# Patient Record
Sex: Female | Born: 1975 | Race: White | Hispanic: No | Marital: Married | Smoking: Current every day smoker
Health system: Southern US, Community
[De-identification: ages and names within clinical notes are randomized; demographics above are authoritative.]

---

## 2016-05-14 ENCOUNTER — Emergency Department (INDEPENDENT_AMBULATORY_CARE_PROVIDER_SITE_OTHER)
Admission: EM | Admit: 2016-05-14 | Discharge: 2016-05-14 | Disposition: A | Discharge status: Home / Self Care | Payer: 59 | Source: Home / Self Care | Attending: Family Medicine | Admitting: Family Medicine

## 2016-05-14 ENCOUNTER — Emergency Department (INDEPENDENT_AMBULATORY_CARE_PROVIDER_SITE_OTHER): Payer: 59

## 2016-05-14 ENCOUNTER — Encounter: Payer: Self-pay | Admitting: Emergency Medicine

## 2016-05-14 DIAGNOSIS — M79642 Pain in left hand: Secondary | ICD-10-CM

## 2016-05-14 DIAGNOSIS — M25532 Pain in left wrist: Secondary | ICD-10-CM

## 2016-05-14 DIAGNOSIS — S63631A Sprain of interphalangeal joint of left index finger, initial encounter: Secondary | ICD-10-CM

## 2016-05-14 DIAGNOSIS — S63633A Sprain of interphalangeal joint of left middle finger, initial encounter: Secondary | ICD-10-CM

## 2016-05-14 NOTE — ED Provider Notes (Signed)
[facility]    CSN: [id number] Arrival date & time: 05/14/16  1942     History   Chief Complaint Chief Complaint  Patient presents with  . Hand Pain    HPI Renee Maddox is a 41 y.o. female.   Patient fell on ice 3.5 months ago, injuring her left hand.  She complains of persistent soreness in her left second finger, and soreness with decreased range of motion in her left third finger.   The history is provided by the patient.  Hand Pain  This is a chronic problem. Episode onset: 3.5 months ago. The problem occurs constantly. The problem has not changed since onset.Exacerbated by: flexing fingers. Nothing relieves the symptoms. Treatments tried: splinting. The treatment provided no relief.    History reviewed. No pertinent past medical history.  There are no active problems to display for this patient.   History reviewed. No pertinent surgical history.  OB History    No data available       Home Medications    Prior to Admission medications   Medication Sig Start Date End Date Taking? Authorizing Provider  atenolol (TENORMIN) 100 MG tablet Take 100 mg by mouth daily.   Yes Historical Provider, MD  citalopram (CELEXA) 10 MG tablet Take 10 mg by mouth daily.   Yes Historical Provider, MD    Family History History reviewed. No pertinent family history.  Social History Social History  Substance Use Topics  . Smoking status: Current Every Day Smoker  . Smokeless tobacco: Never Used  . Alcohol use Yes     Allergies   Patient has no allergy information on record.   Review of Systems Review of Systems  All other systems reviewed and are negative.    Physical Exam Triage Vital Signs ED Triage Vitals  Enc Vitals Group     BP 05/14/16 2005 (!) 166/89     Pulse Rate 05/14/16 2005 64     Resp --      Temp 05/14/16 2005 98.4 F (36.9 C)     Temp Source 05/14/16 2005 Oral     SpO2 05/14/16 2005 98 %     Weight 05/14/16 2006 194 lb (88 kg)     Height --      Head Circumference --      Peak Flow --      Pain Score 05/14/16 2006 6     Pain Loc --      Pain Edu? --      Excl. in GC? --    No data found.   Updated Vital Signs BP (!) 166/89 (BP Location: Right Arm)   Pulse 64   Temp 98.4 F (36.9 C) (Oral)   Wt 194 lb (88 kg)   LMP 04/23/2016   SpO2 98%   Visual Acuity Right Eye Distance:   Left Eye Distance:   Bilateral Distance:    Right Eye Near:   Left Eye Near:    Bilateral Near:     Physical Exam  Constitutional: She appears well-developed and well-nourished. No distress.  HENT:  Head: Atraumatic.  Eyes: Pupils are equal, round, and reactive to light.  Cardiovascular: Normal rate.   Pulmonary/Chest: Effort normal.  Musculoskeletal:       Left hand: She exhibits decreased range of motion and tenderness.       Hands: Left second finger has mild tenderness over PIP joint but finger has full active range of motion.  Left third finger has tenderness and  mild swelling over the PIP joint.  She cannot actively or passively extend the PIP joint fully.  She also has decreased flexion of the PIP joint.  Distal neurovascular function is intact.    Neurological: She is alert.  Skin: Skin is warm and dry.  Nursing note and vitals reviewed.    UC Treatments / Results  Labs (all labs ordered are listed, but only abnormal results are displayed) Labs Reviewed - No data to display  EKG  EKG Interpretation None       Radiology Dg Wrist Complete Left  Result Date: 05/14/2016 CLINICAL DATA:  Larey Seat 3 months ago.  Left hand and wrist pain. EXAM: LEFT WRIST - COMPLETE 3+ VIEW COMPARISON:  None. FINDINGS: There is no evidence of fracture or dislocation. There is no evidence of arthropathy or other focal bone abnormality. Soft tissues are unremarkable. IMPRESSION: Negative. Electronically Signed   By: Sebastian Ache M.D.   On: 05/14/2016 20:23   Dg Hand Complete Left  Result Date: 05/14/2016 CLINICAL DATA:  Larey Seat 3  months ago.  Left hand and wrist pain. EXAM: LEFT HAND - COMPLETE 3+ VIEW COMPARISON:  None. FINDINGS: There is no evidence of fracture or dislocation. There is no evidence of arthropathy or other focal bone abnormality. Soft tissues are unremarkable. IMPRESSION: Negative. Electronically Signed   By: Sebastian Ache M.D.   On: 05/14/2016 20:23    Procedures Procedures (including critical care time)  Medications Ordered in UC Medications - No data to display   Initial Impression / Assessment and Plan / UC Course  I have reviewed the triage vital signs and the nursing notes.  Pertinent labs & imaging results that were available during my care of the patient were reviewed by me and considered in my medical decision making (see chart for details).    Concern for tendon damage to PIP joint left third finger (unable to actively or passively extend fully).  Followup with Dr. Rodney Langton or Dr. Clementeen [facility] ([facility]) for further evaluation of finger.    Begin finger exercises to left index finger.  Apply ice pack for 10 to 15 minutes, 2 to 3 times daily  Continue until pain and swelling decrease.  May take Ibuprofen , 4 tabs every 8 hours with food.     Final Clinical Impressions(s) / UC Diagnoses   Final diagnoses:  Sprain of interphalangeal joint of left index finger, initial encounter  Sprain of interphalangeal joint of left middle finger, initial encounter    New Prescriptions New Prescriptions   No medications on file     Lattie Haw, MD 05/17/16 [phone]

## 2016-05-14 NOTE — Discharge Instructions (Signed)
Begin finger exercises to left index finger.  Apply ice pack for 10 to 15 minutes, 2 to 3 times daily  Continue until pain and swelling decrease.  May take Ibuprofen , 4 tabs every 8 hours with food.

## 2016-05-14 NOTE — ED Triage Notes (Signed)
Pt states she fell x3 months ago and injured her left hand. c/o pain and swelling.

## 2016-05-17 ENCOUNTER — Telehealth: Payer: Self-pay | Admitting: *Deleted

## 2016-05-17 NOTE — Telephone Encounter (Signed)
Callback: No answer, LMOM encouraging follow up with sports med as instructed by Dr. Cathren Harsh. Phone number given for sports med. Call back as needed.

## 2016-06-07 ENCOUNTER — Encounter: Payer: 59 | Admitting: Family Medicine

## 2016-06-07 ENCOUNTER — Telehealth: Payer: Self-pay | Admitting: *Deleted

## 2016-06-07 NOTE — Telephone Encounter (Signed)
Patient called stating she had to reschedule her appt form today. She wants to know what she can do about her finger. She states she has a splint but once she takes it off she cannot move it. Wants reccomendations. I see that she has an appointment scheduled on 06/14/16. Patient has not yet been seen in this office. Called and left a message that we would have to evaluate her before giving recommendation and to keep f/u appt on 06/14/16. I said on vm that she could keep the finger immobilzed with the splint until she is seen.

## 2016-06-14 ENCOUNTER — Encounter: Payer: 59 | Admitting: Family Medicine

## 2017-08-25 IMAGING — DX DG HAND COMPLETE 3+V*L*
3 series · 3 of 3 positions shown · non-contrast
Comparison: None.

CLINICAL DATA: Fell 3 months ago.  Left hand and wrist pain.

EXAM:
LEFT HAND - COMPLETE 3+ VIEW

[hand pa]
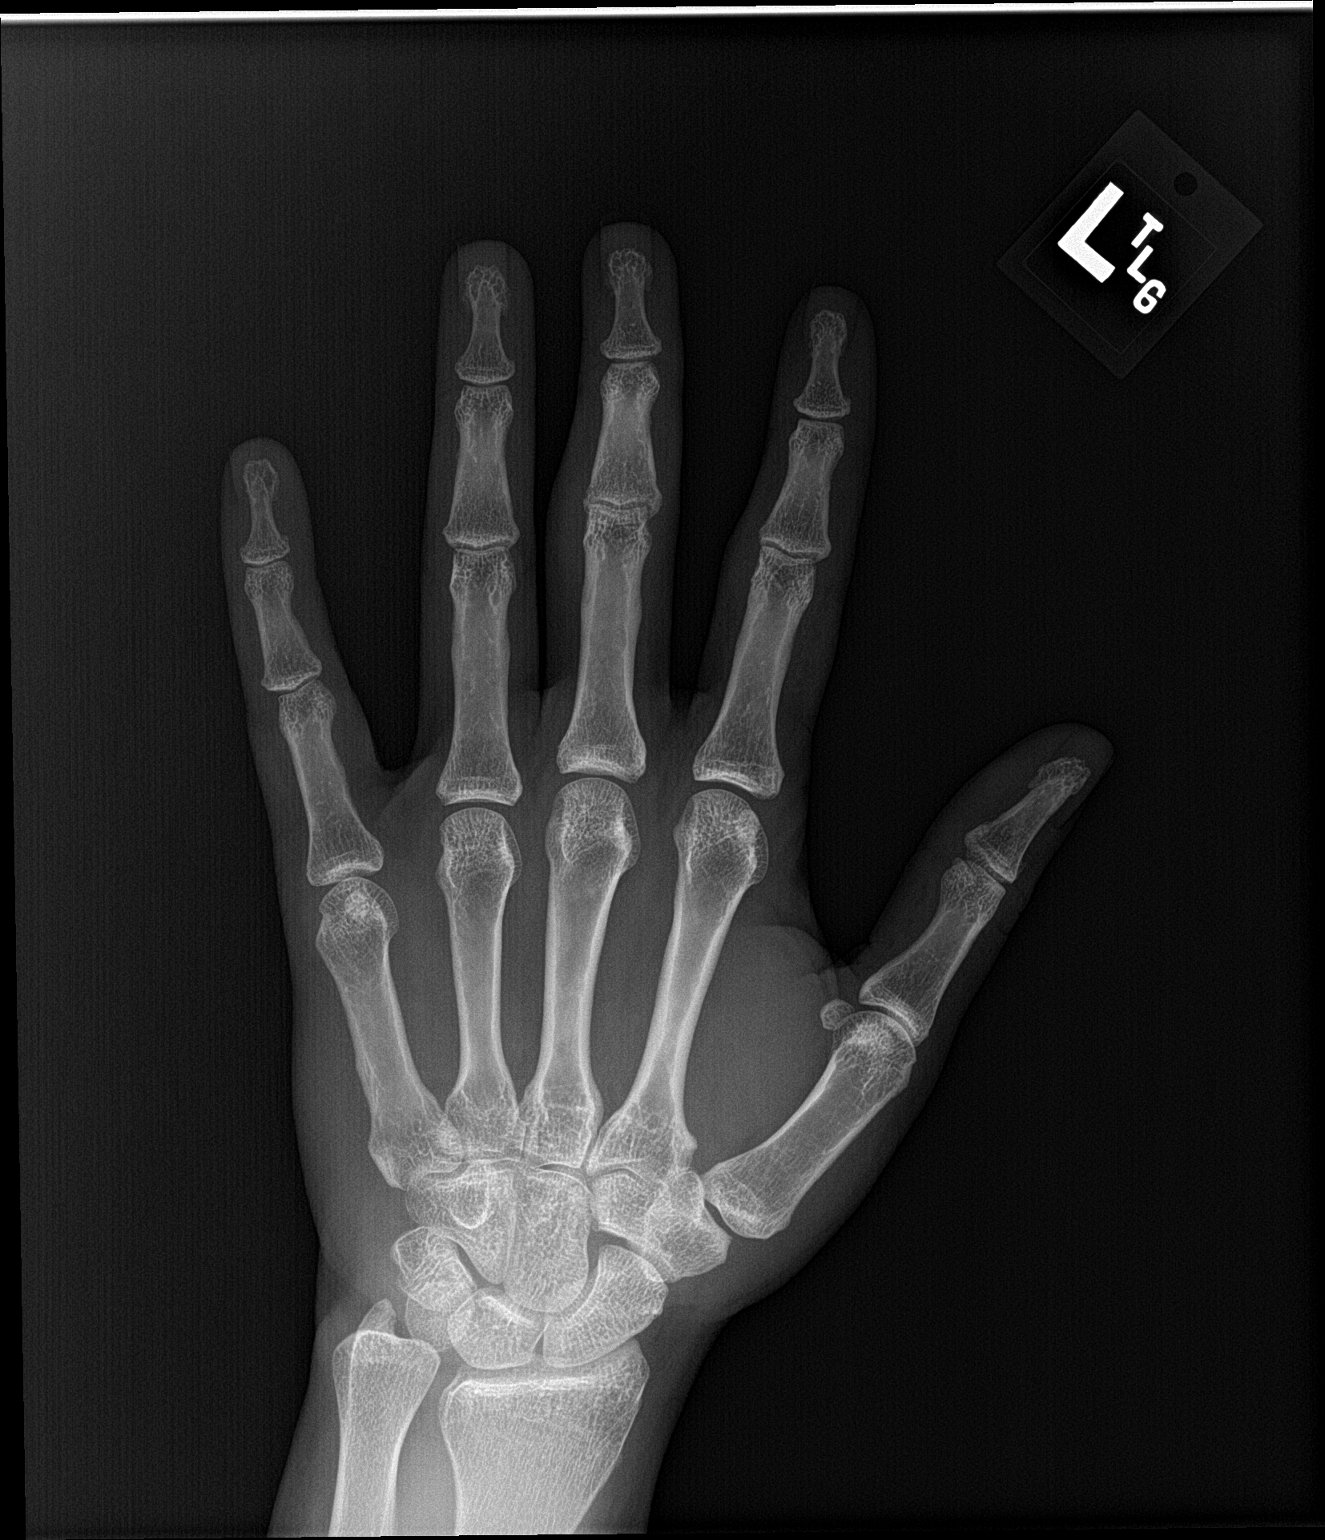

[hand obl]
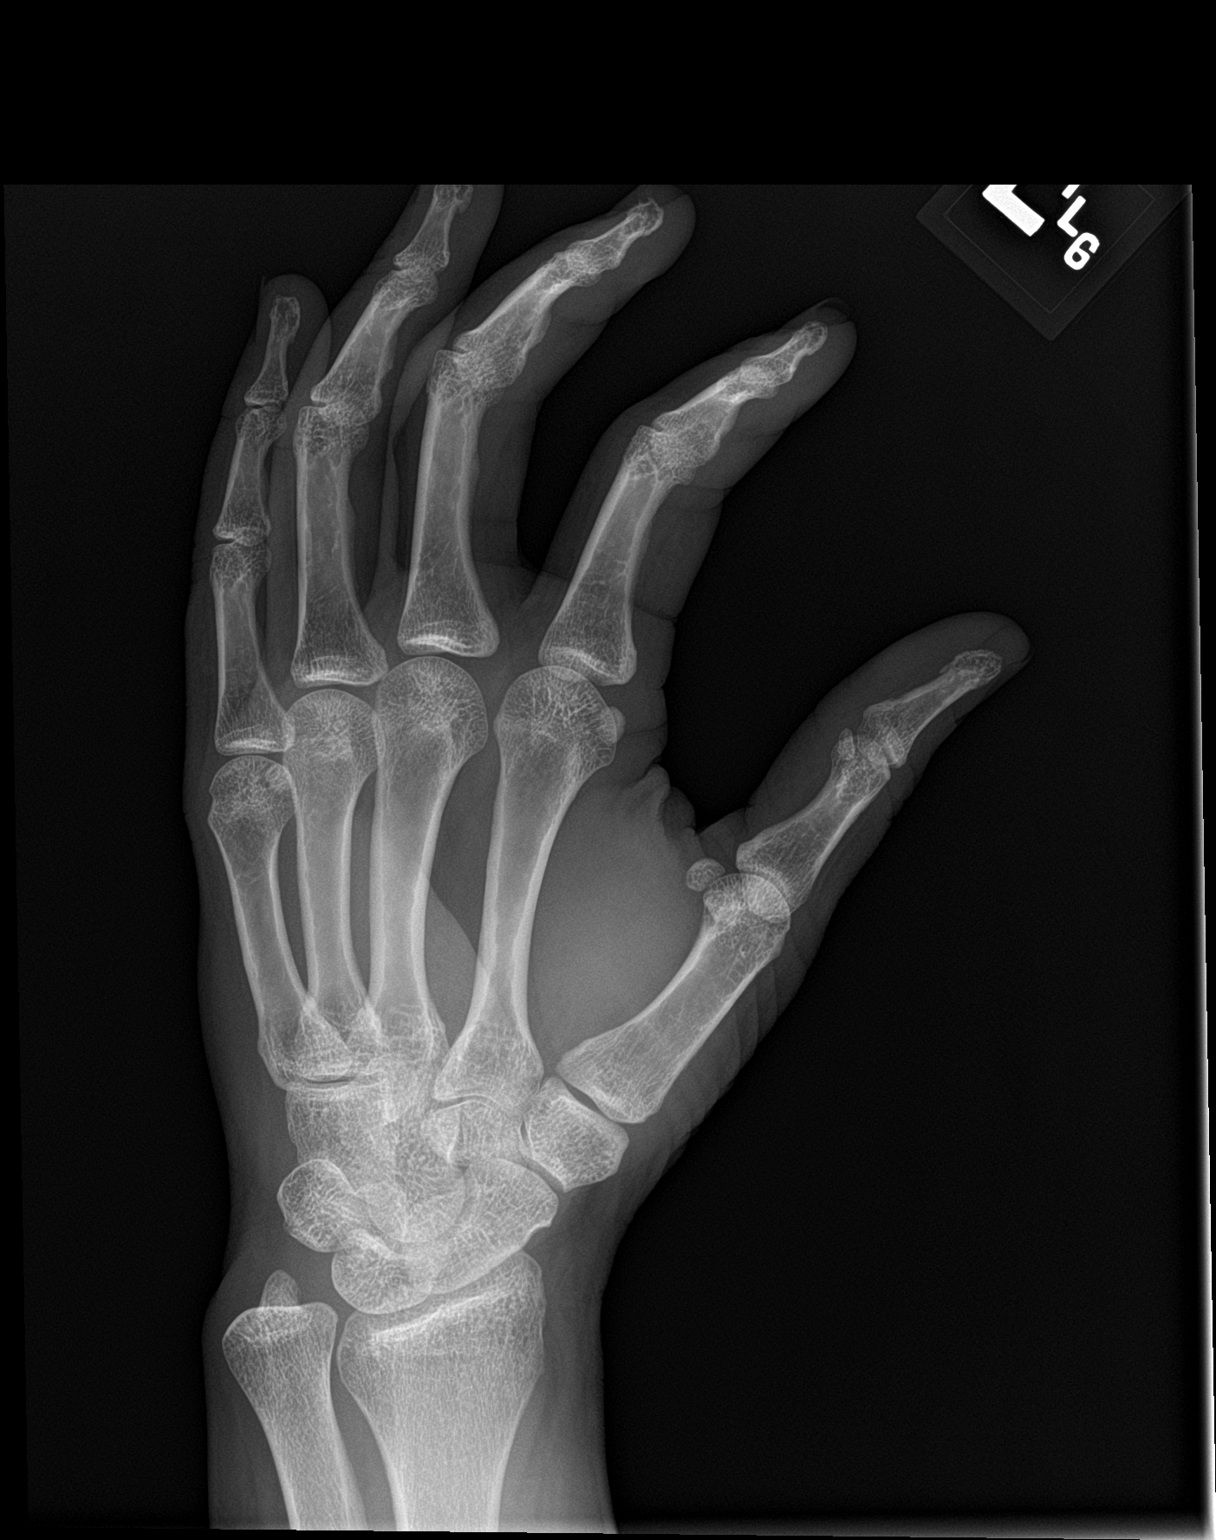

[hand lat]
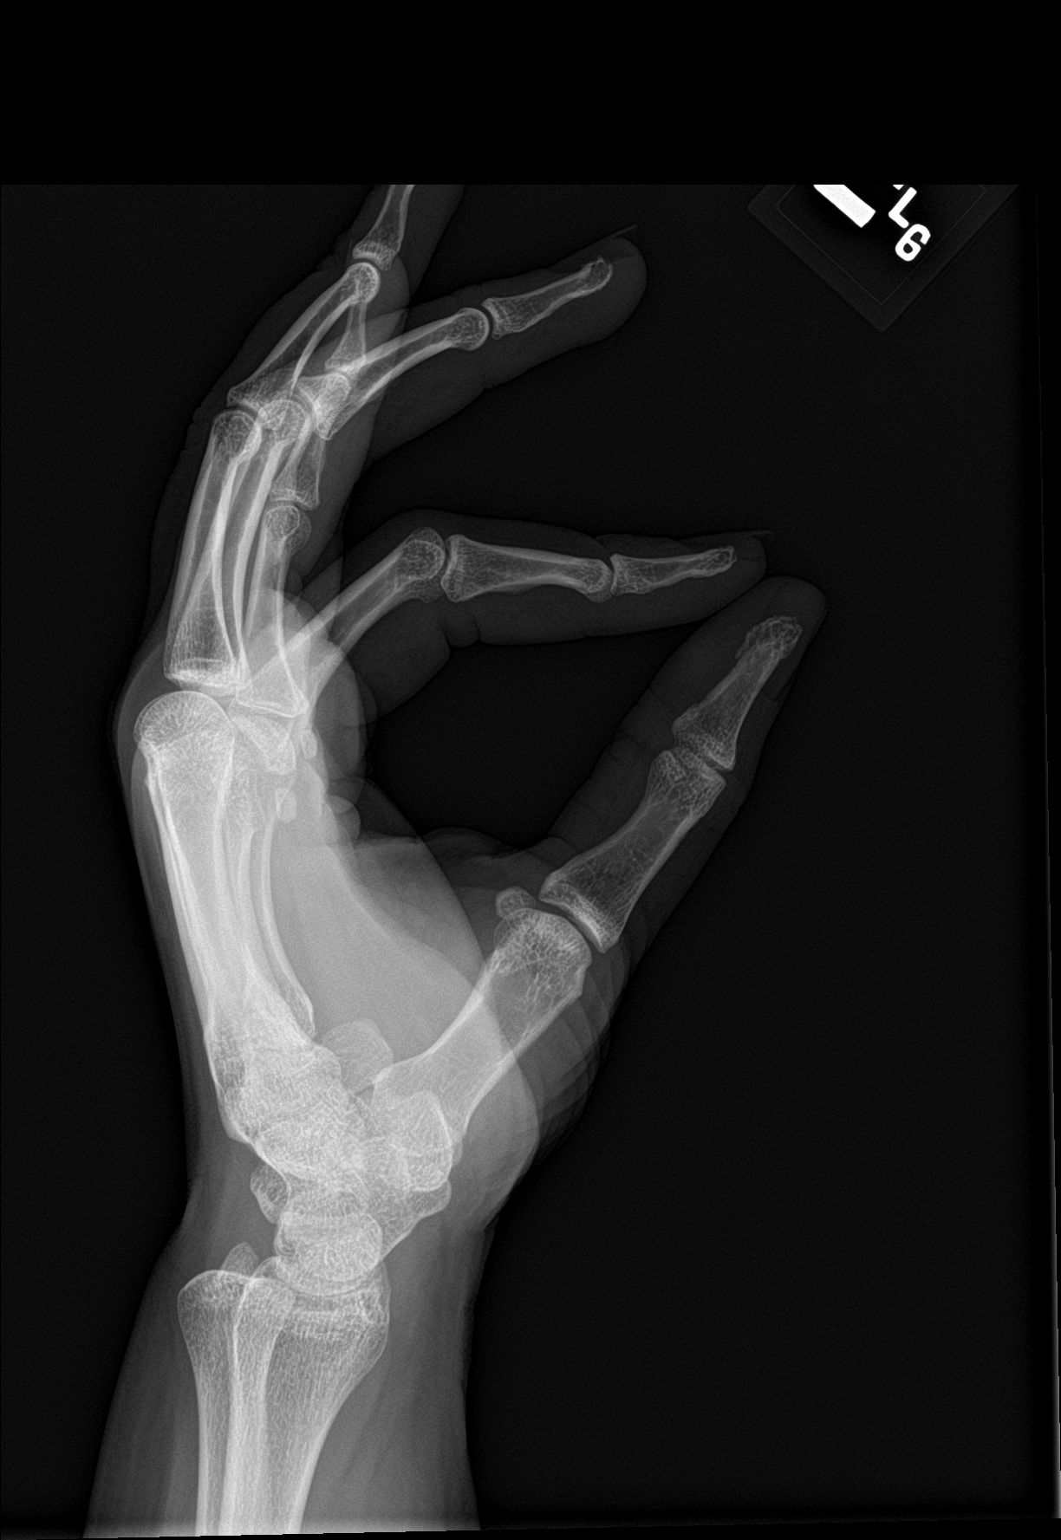

[3 of 3 positions shown; findings below may reference images not displayed]

FINDINGS: There is no evidence of fracture or dislocation. There is no
evidence of arthropathy or other focal bone abnormality. Soft
tissues are unremarkable.
IMPRESSION: Negative.
